# Patient Record
Sex: Male | Born: 1945 | Race: White | Hispanic: No | Marital: Married | Smoking: Never smoker
Health system: Southern US, Community
[De-identification: ages and names within clinical notes are randomized; demographics above are authoritative.]

## PROBLEM LIST (undated history)

## (undated) DIAGNOSIS — M1990 Unspecified osteoarthritis, unspecified site: Secondary | ICD-10-CM

## (undated) HISTORY — PX: JOINT REPLACEMENT: SHX530

## (undated) HISTORY — PX: TONSILLECTOMY: SUR1361

## (undated) HISTORY — PX: APPENDECTOMY: SHX54

---

## 2014-05-22 ENCOUNTER — Encounter: Payer: Self-pay | Admitting: Sports Medicine

## 2014-06-05 ENCOUNTER — Encounter: Payer: Self-pay | Admitting: Sports Medicine

## 2014-06-05 ENCOUNTER — Ambulatory Visit (INDEPENDENT_AMBULATORY_CARE_PROVIDER_SITE_OTHER): Payer: Medicare Other | Admitting: Sports Medicine

## 2014-06-05 ENCOUNTER — Ambulatory Visit (INDEPENDENT_AMBULATORY_CARE_PROVIDER_SITE_OTHER): Payer: Medicare Other

## 2014-06-05 VITALS — BP 126/75 | HR 75 | Wt 218.0 lb

## 2014-06-05 DIAGNOSIS — M1000 Idiopathic gout, unspecified site: Secondary | ICD-10-CM | POA: Diagnosis not present

## 2014-06-05 DIAGNOSIS — M19071 Primary osteoarthritis, right ankle and foot: Secondary | ICD-10-CM

## 2014-06-05 DIAGNOSIS — M25471 Effusion, right ankle: Secondary | ICD-10-CM | POA: Diagnosis not present

## 2014-06-05 NOTE — Progress Notes (Signed)
  Subjective:    CC: Right ankle pain   HPI:  For decades this pleasant 69 year old male has had pain that he localizes around his right ankle, he has no history of trauma or infection, he does have swelling, pain is moderate, persistent, worse with the first few steps, and better with movement.  Past medical history, Surgical history, Family history not pertinant except as noted below, Social history, Allergies, and medications have been entered into the medical record, reviewed, and no changes needed.   Review of Systems: No headache, visual changes, nausea, vomiting, diarrhea, constipation, dizziness, abdominal pain, skin rash, fevers, chills, night sweats, swollen lymph nodes, weight loss, chest pain, body aches, joint swelling, muscle aches, shortness of breath, mood changes, visual or auditory hallucinations.  Objective:    General: Well Developed, well nourished, and in no acute distress.  Neuro: Alert and oriented x3, extra-ocular muscles intact, sensation grossly intact.  HEENT: Normocephalic, atraumatic, pupils equal round reactive to light, neck supple, no masses, no lymphadenopathy, thyroid nonpalpable.  Skin: Warm and dry, no rashes noted.  Cardiac: Regular rate and rhythm, no murmurs rubs or gallops.  Respiratory: Clear to auscultation bilaterally. Not using accessory muscles, speaking in full sentences.  Abdominal: Soft, nontender, nondistended, positive bowel sounds, no masses, no organomegaly.  Right Ankle: Swollen, tender to palpation over the mortise, very limited range of motion. No palpable effusion Strength is 5/5 in all directions. Stable lateral and medial ligaments; squeeze test and kleiger test unremarkable; Talar dome nontender; No pain at base of 5th MT; No tenderness over cuboid; No tenderness over N spot or navicular prominence No tenderness on posterior aspects of lateral and medial malleolus No sign of peroneal tendon subluxations; Negative tarsal tunnel  tinel's  Procedure: Real-time Ultrasound Guided Injection of right ankle/talocrural joint Device: GE Logiq E  Verbal informed consent obtained.  Time-out conducted.  Noted no overlying erythema, induration, or other signs of local infection.  Skin prepped in a sterile fashion.  Local anesthesia: Topical Ethyl chloride.  With sterile technique and under real time ultrasound guidance:  Noted significant degenerative arthritis, 25-gauge needle advanced into the joint and 1 mL kenalog 40, 3 mL lidocaine injected easily. Completed without difficulty  Pain immediately resolved suggesting accurate placement of the medication.  Advised to call if fevers/chills, erythema, induration, drainage, or persistent bleeding.  Images permanently stored and available for review in the ultrasound unit.  Impression: Technically successful ultrasound guided injection.   Impression and Recommendations:    The patient was counselled, risk factors were discussed, anticipatory guidance given.

## 2014-06-05 NOTE — Assessment & Plan Note (Signed)
Injection as above. Meloxicam. X-rays, return for custom orthotics, testing for rheumatoid arthritis and gout.

## 2014-06-06 LAB — RHEUMATOID FACTOR: Rheumatoid fact SerPl-aCnc: 11 [IU]/mL (ref <=14)

## 2014-06-06 LAB — COMPREHENSIVE METABOLIC PANEL
AST: 24 U/L (ref 0–37)
Albumin: 4.1 g/dL (ref 3.5–5.2)
Alkaline Phosphatase: 62 U/L (ref 39–117)
BUN: 22 mg/dL (ref 6–23)
Calcium: 9.8 mg/dL (ref 8.4–10.5)
Chloride: 105 mEq/L (ref 96–112)
Creat: 0.93 mg/dL (ref 0.50–1.35)
Sodium: 140 mEq/L (ref 135–145)
Total Bilirubin: 0.5 mg/dL (ref 0.2–1.2)

## 2014-06-06 LAB — ANA: Anti Nuclear Antibody(ANA): NEGATIVE

## 2014-06-06 LAB — COMPREHENSIVE METABOLIC PANEL WITH GFR
ALT: 33 U/L (ref 0–53)
CO2: 24 meq/L (ref 19–32)
Glucose, Bld: 91 mg/dL (ref 70–99)
Potassium: 4 meq/L (ref 3.5–5.3)
Total Protein: 6.9 g/dL (ref 6.0–8.3)

## 2014-06-06 LAB — URIC ACID: Uric Acid, Serum: 6.7 mg/dL (ref 4.0–7.8)

## 2014-06-09 LAB — CYCLIC CITRUL PEPTIDE ANTIBODY, IGG: Cyclic Citrullin Peptide Ab: <2 U/mL (ref 0.0–5.0)

## 2014-06-10 DIAGNOSIS — M109 Gout, unspecified: Secondary | ICD-10-CM | POA: Insufficient documentation

## 2014-06-10 MED ORDER — ALLOPURINOL 300 MG PO TABS: 300.0000 mg | ORAL_TABLET | Freq: Every day | ORAL | Status: DC

## 2014-06-10 NOTE — Assessment & Plan Note (Signed)
Symptoms and presentation did not appear to represent acute gout, uric acid levels are slightly high so we are going to start once a day allopurinol and recheck in one month.

## 2014-06-10 NOTE — Addendum Note (Signed)
Addended by: Monica BectonHEKKEKANDAM, Yafet Cline J on: 06/10/2014 01:03 PM   Modules accepted: Orders

## 2014-06-30 ENCOUNTER — Encounter: Payer: Self-pay | Admitting: Sports Medicine

## 2014-06-30 ENCOUNTER — Ambulatory Visit (INDEPENDENT_AMBULATORY_CARE_PROVIDER_SITE_OTHER): Payer: Medicare Other | Admitting: Sports Medicine

## 2014-06-30 VITALS — BP 110/77 | HR 73 | Ht 71.0 in | Wt 216.0 lb

## 2014-06-30 DIAGNOSIS — M1000 Idiopathic gout, unspecified site: Secondary | ICD-10-CM

## 2014-06-30 DIAGNOSIS — M19071 Primary osteoarthritis, right ankle and foot: Secondary | ICD-10-CM

## 2014-06-30 MED ORDER — ALLOPURINOL 300 MG PO TABS: 300.0000 mg | ORAL_TABLET | Freq: Every day | ORAL | Status: DC

## 2014-06-30 NOTE — Progress Notes (Signed)

## 2014-06-30 NOTE — Assessment & Plan Note (Addendum)
Orthotics as above. Injection provided a few days of response. I do think he is headed towards ankle replacement versus fusion. We will see how the orthotics do first.

## 2014-08-11 ENCOUNTER — Ambulatory Visit: Payer: Medicare Other | Admitting: Sports Medicine

## 2014-08-11 ENCOUNTER — Ambulatory Visit: Payer: Medicare Other | Admitting: Family Medicine

## 2014-08-25 ENCOUNTER — Ambulatory Visit: Payer: Medicare Other | Admitting: Sports Medicine

## 2014-11-04 ENCOUNTER — Encounter: Payer: Self-pay | Admitting: Sports Medicine

## 2014-11-04 ENCOUNTER — Ambulatory Visit (INDEPENDENT_AMBULATORY_CARE_PROVIDER_SITE_OTHER): Payer: Medicare Other | Admitting: Sports Medicine

## 2014-11-04 DIAGNOSIS — M19071 Primary osteoarthritis, right ankle and foot: Secondary | ICD-10-CM

## 2014-11-04 NOTE — Assessment & Plan Note (Signed)
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 He is now a candidate for ankle replacement versus fusion, referral to [facility].

## 2014-11-04 NOTE — Progress Notes (Signed)
  Subjective:    CC: Follow-up   HPI: Right ankle osteoarthritis: End-stage, did not respond to rebuild rotation, NSAIDs, injection, or custom orthotics as expected, we did discuss that he would probably be heading towards total ankle arthroplasty.  Past medical history, Surgical history, Family history not pertinant except as noted below, Social history, Allergies, and medications have been entered into the medical record, reviewed, and no changes needed.   Review of Systems: No fevers, chills, night sweats, weight loss, chest pain, or shortness of breath.   Objective:    General: Well Developed, well nourished, and in no acute distress.  Neuro: Alert and oriented x3, extra-ocular muscles intact, sensation grossly intact.  HEENT: Normocephalic, atraumatic, pupils equal round reactive to light, neck supple, no masses, no lymphadenopathy, thyroid nonpalpable.  Skin: Warm and dry, no rashes. Cardiac: Regular rate and rhythm, no murmurs rubs or gallops, no lower extremity edema.  Respiratory: Clear to auscultation bilaterally. Not using accessory muscles, speaking in full sentences. Right Ankle: Visible swollen with tenderness over the mortise. Range of motion is full in all directions. Strength is 5/5 in all directions. Stable lateral and medial ligaments; squeeze test and kleiger test unremarkable; Talar dome nontender; No pain at base of 5th MT; No tenderness over cuboid; No tenderness over N spot or navicular prominence No tenderness on posterior aspects of lateral and medial malleolus No sign of peroneal tendon subluxations; Negative tarsal tunnel tinel's  Impression and Recommendations:

## 2015-06-06 ENCOUNTER — Other Ambulatory Visit: Payer: Self-pay | Admitting: Sports Medicine

## 2017-10-09 ENCOUNTER — Ambulatory Visit (INDEPENDENT_AMBULATORY_CARE_PROVIDER_SITE_OTHER): Payer: Medicare Other | Admitting: Sports Medicine

## 2017-10-09 ENCOUNTER — Encounter: Payer: Self-pay | Admitting: Sports Medicine

## 2017-10-09 DIAGNOSIS — M19071 Primary osteoarthritis, right ankle and foot: Secondary | ICD-10-CM

## 2017-10-09 NOTE — Progress Notes (Signed)
Subjective:    CC: Right ankle pain  HPI: Known osteoarthritis, right-sided, end-stage.  We injected him about 3 years ago, since then he has done okay with topical lidocaine patches and Celebrex.  I did refer him to [facility] for surgical consultation, conservative measures were done.  At this point he continues to have pain, and is agreeable to proceed with surgical intervention, he stepped in a hole, inverting his ankle, and does have some increasing pain.  I reviewed the past medical history, family history, social history, surgical history, and allergies today and no changes were needed.  Please see the problem list section below in epic for further details.  Past Medical History: History reviewed. No pertinent past medical history. Past Surgical History: History reviewed. No pertinent surgical history. Social History: Social History   Socioeconomic History  . Marital status: Married    Spouse name: Not on file  . Number of children: Not on file  . Years of education: Not on file  . Highest education level: Not on file  Occupational History  . Not on file  Social Needs  . Financial resource strain: Not on file  . Food insecurity:    Worry: Not on file    Inability: Not on file  . Transportation needs:    Medical: Not on file    Non-medical: Not on file  Tobacco Use  . Smoking status: Never Smoker  . Smokeless tobacco: Never Used  Substance and Sexual Activity  . Alcohol use: Not on file  . Drug use: Not on file  . Sexual activity: Not on file  Lifestyle  . Physical activity:    Days per week: Not on file    Minutes per session: Not on file  . Stress: Not on file  Relationships  . Social connections:    Talks on phone: Not on file    Gets together: Not on file    Attends religious service: Not on file    Active member of club or organization: Not on file    Attends meetings of clubs or organizations: Not on file    Relationship status: Not on file  Other  Topics Concern  . Not on file  Social History Narrative  . Not on file   Family History: No family history on file. Allergies: No Known Allergies Medications: See med rec.  Review of Systems: No fevers, chills, night sweats, weight loss, chest pain, or shortness of breath.   Objective:    General: Well Developed, well nourished, and in no acute distress.  Neuro: Alert and oriented x3, extra-ocular muscles intact, sensation grossly intact.  HEENT: Normocephalic, atraumatic, pupils equal round reactive to light, neck supple, no masses, no lymphadenopathy, thyroid nonpalpable.  Skin: Warm and dry, no rashes. Cardiac: Regular rate and rhythm, no murmurs rubs or gallops, no lower extremity edema.  Respiratory: Clear to auscultation bilaterally. Not using accessory muscles, speaking in full sentences. Right ankle: Swollen, tender all over.  No bruising. Range of motion is full in all directions. Strength is 5/5 in all directions. Stable lateral and medial ligaments; squeeze test and kleiger test unremarkable; Talar dome nontender; No pain at base of 5th MT; No tenderness over cuboid; No tenderness over N spot or navicular prominence No tenderness on posterior aspects of lateral and medial malleolus No sign of peroneal tendon subluxations; Negative tarsal tunnel tinel's Able to walk 4 steps.  Procedure: Real-time Ultrasound Guided Injection of right ankle joint Device: GE Logiq E  Verbal  informed consent obtained.  Time-out conducted.  Noted no overlying erythema, induration, or other signs of local infection.  Skin prepped in a sterile fashion.  Local anesthesia: Topical Ethyl chloride.  With sterile technique and under real time ultrasound guidance: Severe osteoarthritis noted, I found a good approach angle with fewer osteophytes, and guided the needle into the joint, injected 1 cc kenalog 40, 1 cc lidocaine, 1 cc bupivacaine. Completed without difficulty  Pain immediately  resolved suggesting accurate placement of the medication.  Advised to call if fevers/chills, erythema, induration, drainage, or persistent bleeding.  Images permanently stored and available for review in the ultrasound unit.  Impression: Technically successful ultrasound guided injection.  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 Previous injection was 4 years ago. [facility], they suggested arthrodesis, he was not a good candidate for arthroplasty. He does okay with topical lidocaine patches, Celebrex. Unfortunately had a reinjury, injection as above, I would like a second opinion from Dr. Victorino DikeHewitt regarding ankle arthrodesis. ___________________________________________ Ihor Austinhomas J. Benjamin Stainhekkekandam, M.D., ABFM., CAQSM. Primary Care and Sports Medicine [facility]  Adjunct Instructor of Family Medicine  [employer]

## 2017-10-09 NOTE — Assessment & Plan Note (Signed)
End-stage right ankle osteoarthritis. Previous injection was 4 years ago. We [facility], they suggested arthrodesis, he was not a good candidate for arthroplasty. He does okay with topical lidocaine patches, Celebrex. Unfortunately had a reinjury, injection as above, I would like a second opinion from Dr. Victorino DikeHewitt regarding ankle arthrodesis.

## 2017-10-11 ENCOUNTER — Ambulatory Visit (INDEPENDENT_AMBULATORY_CARE_PROVIDER_SITE_OTHER): Payer: Medicare Other

## 2017-10-11 DIAGNOSIS — M19071 Primary osteoarthritis, right ankle and foot: Secondary | ICD-10-CM | POA: Diagnosis not present

## 2017-12-06 ENCOUNTER — Other Ambulatory Visit (HOSPITAL_COMMUNITY): Payer: Self-pay | Admitting: Orthopedic Surgery

## 2017-12-29 ENCOUNTER — Encounter (HOSPITAL_BASED_OUTPATIENT_CLINIC_OR_DEPARTMENT_OTHER): Payer: Self-pay | Admitting: *Deleted

## 2017-12-29 ENCOUNTER — Other Ambulatory Visit: Payer: Self-pay

## 2018-01-04 ENCOUNTER — Other Ambulatory Visit: Payer: Self-pay

## 2018-01-04 ENCOUNTER — Ambulatory Visit (HOSPITAL_BASED_OUTPATIENT_CLINIC_OR_DEPARTMENT_OTHER): Payer: Medicare Other | Admitting: Anesthesiology

## 2018-01-04 ENCOUNTER — Encounter (HOSPITAL_BASED_OUTPATIENT_CLINIC_OR_DEPARTMENT_OTHER): Payer: Self-pay

## 2018-01-04 ENCOUNTER — Encounter (HOSPITAL_BASED_OUTPATIENT_CLINIC_OR_DEPARTMENT_OTHER)
Admission: RE | Disposition: A | Discharge status: Home / Self Care | Payer: Self-pay | Source: Ambulatory Visit | Attending: Orthopedic Surgery

## 2018-01-04 ENCOUNTER — Ambulatory Visit (HOSPITAL_BASED_OUTPATIENT_CLINIC_OR_DEPARTMENT_OTHER)
Admission: RE | Admit: 2018-01-04 | Discharge: 2018-01-04 | Disposition: A | Discharge status: Home / Self Care | Payer: Medicare Other | Source: Ambulatory Visit | Attending: Orthopedic Surgery | Admitting: Orthopedic Surgery

## 2018-01-04 DIAGNOSIS — M19071 Primary osteoarthritis, right ankle and foot: Secondary | ICD-10-CM | POA: Diagnosis present

## 2018-01-04 HISTORY — PX: ANKLE FUSION: SHX881

## 2018-01-04 HISTORY — DX: Unspecified osteoarthritis, unspecified site: M19.90

## 2018-01-04 SURGERY — ARTHRODESIS ANKLE
Anesthesia: General | Site: Ankle | Laterality: Right

## 2018-01-04 MED ORDER — FENTANYL CITRATE (PF) 100 MCG/2ML IJ SOLN
50.0000 ug | INTRAMUSCULAR | Status: DC | PRN
  Administered 2018-01-04: 50 ug via INTRAVENOUS

## 2018-01-04 MED ORDER — CEFAZOLIN SODIUM-DEXTROSE 2-4 GM/100ML-% IV SOLN
INTRAVENOUS | Status: AC
  Filled 2018-01-04: qty 100

## 2018-01-04 MED ORDER — SENNA 8.6 MG PO TABS: 2.0000 | ORAL_TABLET | Freq: Two times a day (BID) | ORAL | 0 refills | Status: AC

## 2018-01-04 MED ORDER — EPHEDRINE SULFATE 50 MG/ML IJ SOLN
INTRAMUSCULAR | Status: DC | PRN
  Administered 2018-01-04: 15 mg via INTRAVENOUS

## 2018-01-04 MED ORDER — LIDOCAINE 2% (20 MG/ML) 5 ML SYRINGE
INTRAMUSCULAR | Status: AC
  Filled 2018-01-04: qty 5

## 2018-01-04 MED ORDER — ONDANSETRON HCL 4 MG/2ML IJ SOLN
INTRAMUSCULAR | Status: DC | PRN
  Administered 2018-01-04: 4 mg via INTRAVENOUS

## 2018-01-04 MED ORDER — LIDOCAINE HCL (CARDIAC) PF 100 MG/5ML IV SOSY
PREFILLED_SYRINGE | INTRAVENOUS | Status: DC | PRN
  Administered 2018-01-04: 30 mg via INTRAVENOUS

## 2018-01-04 MED ORDER — FENTANYL CITRATE (PF) 100 MCG/2ML IJ SOLN
INTRAMUSCULAR | Status: AC
  Filled 2018-01-04: qty 2

## 2018-01-04 MED ORDER — DOCUSATE SODIUM 100 MG PO CAPS: 100.0000 mg | ORAL_CAPSULE | Freq: Two times a day (BID) | ORAL | 0 refills | Status: AC

## 2018-01-04 MED ORDER — PROPOFOL 10 MG/ML IV BOLUS
INTRAVENOUS | Status: AC
  Filled 2018-01-04: qty 20

## 2018-01-04 MED ORDER — CEFAZOLIN SODIUM-DEXTROSE 2-4 GM/100ML-% IV SOLN
2.0000 g | INTRAVENOUS | Status: AC
  Administered 2018-01-04: 2 g via INTRAVENOUS

## 2018-01-04 MED ORDER — ONDANSETRON HCL 4 MG/2ML IJ SOLN
INTRAMUSCULAR | Status: AC
  Filled 2018-01-04: qty 2

## 2018-01-04 MED ORDER — FENTANYL CITRATE (PF) 100 MCG/2ML IJ SOLN: 25.0000 ug | INTRAMUSCULAR | Status: DC | PRN

## 2018-01-04 MED ORDER — PROPOFOL 10 MG/ML IV BOLUS
INTRAVENOUS | Status: DC | PRN
  Administered 2018-01-04: 200 mg via INTRAVENOUS

## 2018-01-04 MED ORDER — EPHEDRINE 5 MG/ML INJ
INTRAVENOUS | Status: AC
  Filled 2018-01-04: qty 10

## 2018-01-04 MED ORDER — DEXAMETHASONE SODIUM PHOSPHATE 10 MG/ML IJ SOLN
INTRAMUSCULAR | Status: AC
  Filled 2018-01-04: qty 1

## 2018-01-04 MED ORDER — CHLORHEXIDINE GLUCONATE 4 % EX LIQD: 60.0000 mL | Freq: Once | CUTANEOUS | Status: DC

## 2018-01-04 MED ORDER — ROPIVACAINE HCL 5 MG/ML IJ SOLN
INTRAMUSCULAR | Status: DC | PRN
  Administered 2018-01-04: 30 mL via PERINEURAL
  Administered 2018-01-04: 10 mL via PERINEURAL

## 2018-01-04 MED ORDER — 0.9 % SODIUM CHLORIDE (POUR BTL) OPTIME
TOPICAL | Status: DC | PRN
  Administered 2018-01-04: 600 mL

## 2018-01-04 MED ORDER — ONDANSETRON HCL 4 MG/2ML IJ SOLN: 4.0000 mg | Freq: Once | INTRAMUSCULAR | Status: DC | PRN

## 2018-01-04 MED ORDER — LACTATED RINGERS IV SOLN
INTRAVENOUS | Status: DC
  Administered 2018-01-04: 11:00:00 via INTRAVENOUS

## 2018-01-04 MED ORDER — SCOPOLAMINE 1 MG/3DAYS TD PT72: 1.0000 | MEDICATED_PATCH | Freq: Once | TRANSDERMAL | Status: DC | PRN

## 2018-01-04 MED ORDER — SODIUM CHLORIDE 0.9 % IV SOLN: INTRAVENOUS | Status: DC

## 2018-01-04 MED ORDER — MIDAZOLAM HCL 2 MG/2ML IJ SOLN: 1.0000 mg | INTRAMUSCULAR | Status: DC | PRN

## 2018-01-04 MED ORDER — OXYCODONE HCL 5 MG PO TABS: 5.0000 mg | ORAL_TABLET | ORAL | 0 refills | Status: AC | PRN

## 2018-01-04 MED ORDER — ASPIRIN EC 81 MG PO TBEC: 81.0000 mg | DELAYED_RELEASE_TABLET | Freq: Two times a day (BID) | ORAL | 0 refills | Status: AC

## 2018-01-04 MED ORDER — DEXAMETHASONE SODIUM PHOSPHATE 4 MG/ML IJ SOLN
INTRAMUSCULAR | Status: DC | PRN
  Administered 2018-01-04: 10 mg via INTRAVENOUS

## 2018-01-04 SURGICAL SUPPLY — 82 items
BANDAGE ESMARK 6X9 LF (GAUZE/BANDAGES/DRESSINGS) ×1 IMPLANT
BIT DRILL 4 CANN (BIT) ×3 IMPLANT
BLADE AVERAGE 25MMX9MM (BLADE)
BLADE AVERAGE 25X9 (BLADE) IMPLANT
BLADE MICRO SAGITTAL (BLADE) IMPLANT
BLADE SURG 15 STRL LF DISP TIS (BLADE) ×3 IMPLANT
BLADE SURG 15 STRL SS (BLADE) ×6
BNDG COHESIVE 4X5 TAN STRL (GAUZE/BANDAGES/DRESSINGS) ×3 IMPLANT
BNDG COHESIVE 6X5 TAN STRL LF (GAUZE/BANDAGES/DRESSINGS) ×3 IMPLANT
BNDG ESMARK 6X9 LF (GAUZE/BANDAGES/DRESSINGS) ×3
BONE GRAFT AUGMENT 3CC (Orthopedic Implant) ×3 IMPLANT
BOOT STEPPER DURA LG (SOFTGOODS) IMPLANT
BOOT STEPPER DURA MED (SOFTGOODS) IMPLANT
BOOT STEPPER DURA XLG (SOFTGOODS) IMPLANT
BUR EGG 3PK/BX (BURR) ×3 IMPLANT
CHLORAPREP W/TINT 26ML (MISCELLANEOUS) ×3 IMPLANT
CLOSURE WOUND 1/2 X4 (GAUZE/BANDAGES/DRESSINGS)
COVER BACK TABLE 60X90IN (DRAPES) ×3 IMPLANT
COVER MAYO STAND STRL (DRAPES) ×3 IMPLANT
COVER WAND RF STERILE (DRAPES) IMPLANT
CUFF TOURNIQUET SINGLE 34IN LL (TOURNIQUET CUFF) ×3 IMPLANT
DECANTER SPIKE VIAL GLASS SM (MISCELLANEOUS) IMPLANT
DRAPE EXTREMITY T 121X128X90 (DRAPE) ×3 IMPLANT
DRAPE OEC MINIVIEW 54X84 (DRAPES) ×3 IMPLANT
DRAPE U-SHAPE 47X51 STRL (DRAPES) ×3 IMPLANT
DRAPE UTILITY XL STRL (DRAPES) IMPLANT
DRSG MEPITEL 4X7.2 (GAUZE/BANDAGES/DRESSINGS) ×3 IMPLANT
DRSG PAD ABDOMINAL 8X10 ST (GAUZE/BANDAGES/DRESSINGS) ×6 IMPLANT
ELECT REM PT RETURN 9FT ADLT (ELECTROSURGICAL) ×3
ELECTRODE REM PT RTRN 9FT ADLT (ELECTROSURGICAL) ×1 IMPLANT
GAUZE SPONGE 4X4 12PLY STRL (GAUZE/BANDAGES/DRESSINGS) ×3 IMPLANT
GLOVE BIO SURGEON STRL SZ 6.5 (GLOVE) ×4 IMPLANT
GLOVE BIO SURGEON STRL SZ8 (GLOVE) ×3 IMPLANT
GLOVE BIO SURGEONS STRL SZ 6.5 (GLOVE) ×2
GLOVE BIOGEL PI IND STRL 7.0 (GLOVE) ×3 IMPLANT
GLOVE BIOGEL PI IND STRL 8 (GLOVE) ×2 IMPLANT
GLOVE BIOGEL PI INDICATOR 7.0 (GLOVE) ×6
GLOVE BIOGEL PI INDICATOR 8 (GLOVE) ×4
GLOVE ECLIPSE 8.0 STRL XLNG CF (GLOVE) ×3 IMPLANT
GOWN STRL REUS W/ TWL LRG LVL3 (GOWN DISPOSABLE) ×2 IMPLANT
GOWN STRL REUS W/ TWL XL LVL3 (GOWN DISPOSABLE) ×2 IMPLANT
GOWN STRL REUS W/TWL LRG LVL3 (GOWN DISPOSABLE) ×4
GOWN STRL REUS W/TWL XL LVL3 (GOWN DISPOSABLE) ×4
GUIDEWIRE W/TROCAR NT 12IN (WIRE) ×3 IMPLANT
INSTRUMENT LRG BB TAK DISP (EXFIX) ×6 IMPLANT
K-WIRE 9  SMOOTH .062 (WIRE) IMPLANT
KIT ASP BONE MRW 60CC (KITS) IMPLANT
MILL MEDIUM DISP (BLADE) IMPLANT
NEEDLE HYPO 22GX1.5 SAFETY (NEEDLE) IMPLANT
NS IRRIG 1000ML POUR BTL (IV SOLUTION) ×3 IMPLANT
PACK BASIN DAY SURGERY FS (CUSTOM PROCEDURE TRAY) ×3 IMPLANT
PAD CAST 4YDX4 CTTN HI CHSV (CAST SUPPLIES) ×1 IMPLANT
PADDING CAST COTTON 4X4 STRL (CAST SUPPLIES) ×2
PADDING CAST COTTON 6X4 STRL (CAST SUPPLIES) ×3 IMPLANT
PENCIL BUTTON HOLSTER BLD 10FT (ELECTRODE) ×3 IMPLANT
SANITIZER HAND PURELL 535ML FO (MISCELLANEOUS) ×3 IMPLANT
SCREW CANN 6.7X55 18 THD SD (Screw) ×3 IMPLANT
SCREW LOW PROFILE TIT 6.7X75 (Screw) ×3 IMPLANT
SCREW LP TI 6.7X50M CANN 18THR (Screw) ×3 IMPLANT
SHEET MEDIUM DRAPE 40X70 STRL (DRAPES) ×3 IMPLANT
SLEEVE SCD COMPRESS KNEE MED (MISCELLANEOUS) ×3 IMPLANT
SPLINT FAST PLASTER 5X30 (CAST SUPPLIES) ×40
SPLINT PLASTER CAST FAST 5X30 (CAST SUPPLIES) ×20 IMPLANT
SPONGE LAP 18X18 RF (DISPOSABLE) ×3 IMPLANT
STOCKINETTE 6  STRL (DRAPES) ×2
STOCKINETTE 6 STRL (DRAPES) ×1 IMPLANT
STRIP CLOSURE SKIN 1/2X4 (GAUZE/BANDAGES/DRESSINGS) IMPLANT
SUCTION FRAZIER HANDLE 10FR (MISCELLANEOUS) ×2
SUCTION TUBE FRAZIER 10FR DISP (MISCELLANEOUS) ×1 IMPLANT
SUT ETHILON 3 0 PS 1 (SUTURE) ×3 IMPLANT
SUT MNCRL AB 3-0 PS2 18 (SUTURE) ×3 IMPLANT
SUT VIC AB 0 SH 27 (SUTURE) ×3 IMPLANT
SUT VIC AB 2-0 SH 27 (SUTURE) ×2
SUT VIC AB 2-0 SH 27XBRD (SUTURE) ×1 IMPLANT
SYR 3ML 23GX1 SAFETY (SYRINGE) ×3 IMPLANT
SYR BULB 3OZ (MISCELLANEOUS) ×3 IMPLANT
SYR CONTROL 10ML LL (SYRINGE) IMPLANT
TOWEL GREEN STERILE FF (TOWEL DISPOSABLE) ×6 IMPLANT
TUBE CONNECTING 20'X1/4 (TUBING) ×1
TUBE CONNECTING 20X1/4 (TUBING) ×2 IMPLANT
UNDERPAD 30X30 (UNDERPADS AND DIAPERS) ×3 IMPLANT
YANKAUER SUCT BULB TIP NO VENT (SUCTIONS) IMPLANT

## 2018-01-04 NOTE — Anesthesia Procedure Notes (Signed)
Anesthesia Regional Block: Adductor canal block   Pre-Anesthetic Checklist: ,, timeout performed, Correct Patient, Correct Site, Correct Laterality, Correct Procedure, Correct Position, site marked, Risks and benefits discussed,  Surgical consent,  Pre-op evaluation,  At surgeon's request and post-op pain management  Laterality: Right  Prep: chloraprep       Needles:  Injection technique: Single-shot  Needle Type: Echogenic Needle     Needle Length: 9cm  Needle Gauge: 21     Additional Needles:   Procedures:,,,, ultrasound used (permanent image in chart),,,,  Narrative:  Start time: 01/04/2018 11:03 AM End time: 01/04/2018 11:05 AM Injection made incrementally with aspirations every 5 mL.  Performed by: Personally  Anesthesiologist: Cecile Hearingurk, Kristina Mcnorton Edward, MD  Additional Notes: No pain on injection. No increased resistance to injection. Injection made in 5cc increments.  Good needle visualization.  Patient tolerated procedure well.

## 2018-01-04 NOTE — Progress Notes (Signed)
Assisted Dr. Turk with right, ultrasound guided, popliteal/saphenous block. Side rails up, monitors on throughout procedure. See vital signs in flow sheet. Tolerated Procedure well. 

## 2018-01-04 NOTE — Transfer of Care (Signed)
Immediate Anesthesia Transfer of Care Note  Patient: Randall SaxLeroy F Mersereau Jr.  Procedure(s) Performed: Right ankle arthrodesis (Right Ankle)  Patient Location: PACU  Anesthesia Type:General and Regional  Level of Consciousness: awake and sedated  Airway & Oxygen Therapy: Patient Spontanous Breathing and Patient connected to face mask oxygen  Post-op Assessment: Report given to RN and Post -op Vital signs reviewed and stable  Post vital signs: Reviewed and stable  Last Vitals:  Vitals Value Taken Time  BP    Temp    Pulse    Resp    SpO2      Last Pain:  Vitals:   01/04/18 1019  TempSrc: Oral  PainSc: 1       Patients Stated Pain Goal: 3 (01/04/18 1019)  Complications: No apparent anesthesia complications

## 2018-01-04 NOTE — H&P (Signed)
Randall SaxLeroy F Barefield Jr. is an 72 y.o. male.   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  He has failed nonoperative treatment for this painful condition including activity modification, oral anti-inflammatories, bracing and therapy.  He presents now for operative treatment of this painful and limiting condition.  Past Medical History:  Diagnosis Date  . Arthritis     Past Surgical History:  Procedure Laterality Date  . APPENDECTOMY    . JOINT REPLACEMENT     both knees  . TONSILLECTOMY      History reviewed. No pertinent family history. Social History:  reports that he has never smoked. He has never used smokeless tobacco. He reports that he does not drink alcohol or use drugs.  Allergies: No Known Allergies  Medications Prior to Admission  Medication Sig Dispense Refill  . aspirin 81 MG tablet Take 81 mg by mouth daily.    . celecoxib (CELEBREX) 200 MG capsule Take 200 mg by mouth 2 (two) times daily.    . Multiple Vitamin (MULTIVITAMIN) tablet Take 1 tablet by mouth daily.    . pravastatin (PRAVACHOL) 10 MG tablet Take 10 mg by mouth daily.    Marland Kitchen. albuterol (PROVENTIL HFA;VENTOLIN HFA) 108 (90 BASE) MCG/ACT inhaler Inhale 2 puffs into the lungs every 6 (six) hours as needed for wheezing or shortness of breath.      No results found for this or any previous visit (from the past 48 hour(s)). No results found.  ROS no recent fever, chills, nausea, vomiting or changes in his appetite  Blood pressure (!) 152/85, pulse (!) 59, temperature 98.3 F (36.8 C), temperature source Oral, resp. rate 12, height 5\' 11"  (1.803 m), weight 96.2 kg, SpO2 98 %. Physical Exam  Well-nourished well-developed man in no apparent distress.  Alert and oriented x4.  Mood and affect are normal.  Extraocular motions are intact.  Respirations are unlabored.  Gait is antalgic to the right.  Right  ankle has decreased range of motion.  Skin is healthy and intact.  No lymphadenopathy.  Pulses are palpable.  Sensibility to light touch is intact dorsally and plantarly at the foot.  He is tender to palpation along the anteromedial and anterolateral joint line.  Assessment/Plan Right ankle post traumatic arthritis -to the operating room today for right ankle arthrodesis.  The risks and benefits of the alternative treatment options have been discussed in detail.  The patient wishes to proceed with surgery and specifically understands risks of bleeding, infection, nerve damage, blood clots, need for additional surgery, amputation and death.   Toni ArthursJohn Kiriana Worthington, MD 01/04/2018, 11:59 AM

## 2018-01-04 NOTE — Anesthesia Postprocedure Evaluation (Signed)
Anesthesia Post Note  Patient: Randall SaxLeroy F Cervantes Jr.  Procedure(s) Performed: Right ankle arthrodesis (Right Ankle)     Patient location during evaluation: PACU Anesthesia Type: General Level of consciousness: awake and alert, awake and oriented Pain management: pain level controlled Vital Signs Assessment: post-procedure vital signs reviewed and stable Respiratory status: spontaneous breathing, nonlabored ventilation and respiratory function stable Cardiovascular status: blood pressure returned to baseline and stable Postop Assessment: no apparent nausea or vomiting Anesthetic complications: no    Last Vitals:  Vitals:   01/04/18 1415 01/04/18 1430  BP: 135/83 134/81  Pulse: 65 64  Resp: 14 13  Temp:    SpO2: 100% 100%    Last Pain:  Vitals:   01/04/18 1430  TempSrc:   PainSc: 0-No pain                 Cecile HearingStephen Edward Paisley Grajeda

## 2018-01-04 NOTE — Op Note (Signed)
01/04/2018  2:01 PM  PATIENT:  Randall SaxLeroy F Crossett Jr.  72 y.o. male  PRE-OPERATIVE DIAGNOSIS:  primary osteoarthritis right ankle  POST-OPERATIVE DIAGNOSIS:  primary osteoarthritis right ankle  Procedure(s):   1.  Right ankle arthrodesis    2.  Right ankle AP, mortise and lateral xrays  SURGEON:  Toni ArthursJohn Nikol Lemar, MD  ASSISTANT: Alfredo MartinezJustin Ollis, PA-C  ANESTHESIA:   General, regional  EBL:  minimal   TOURNIQUET:   Total Tourniquet Time Documented: Thigh (Right) - 77 minutes Total: Thigh (Right) - 77 minutes  COMPLICATIONS:  None apparent  DISPOSITION:  Extubated, awake and stable to recovery.  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  He has failed nonoperative treatment to date including activity modification, oral anti-inflammatories, bracing and physical therapy.  He presents now for operative treatment of this painful and limiting condition.  The risks and benefits of the alternative treatment options have been discussed in detail.  The patient wishes to proceed with surgery and specifically understands risks of bleeding, infection, nerve damage, blood clots, need for additional surgery, amputation and death.  PROCEDURE IN DETAIL:  After pre operative consent was obtained, and the correct operative site was identified, the patient was brought to the operating room and placed supine on the OR table.  Anesthesia was administered.  Pre-operative antibiotics were administered.  A surgical timeout was taken.  The right lower externally was then prepped and draped in standard sterile fashion.  The extremity was elevated and the tourniquet was inflated to 250 mmHg.  A longitudinal incision was then made over the extensor houses longus tendon.  Dissection was carried down through the subcutaneous tissues.  The extensor retinaculum was incised over the EHL and released proximally and distally.  The interval between the EHL and the  tibialis anterior was then developed taking care to protect the neurovascular bundle.  The anterior joint was exposed.  The joint capsule was incised and elevated medially and laterally.  Large anterior osteophytes were removed with a rondure.  The joint was exposed.  There was severe arthritis involving both sides of the joint with virtually no cartilage left on any joint surface.  The wound was irrigated copiously.  A cutting bur was used to remove the remaining subchondral bone on both sides of the joint.  The bur was used to dimple the cancellus bone.  The wound was thoroughly irrigated throughout this process in order to avoid overheating the bone surfaces.  The joint was reduced and provisionally pinned.  AP and lateral radiographs confirmed appropriate alignment of the tibiotalar joint.  3 cc of Augment bone graft substitute was inserted into the joint.  The joint was again reduced and the pin advanced across into the talus from the medial tibia.  The guidepin was overdrilled and a 6.7 mm Arthrex cannulated screw was inserted.  It was noted to compress the joint appropriately and have excellent purchase.  A second pin was then placed from the posterior lateral tibia across to the head of the talus.  AP and lateral radiographs confirmed appropriate position of the guidewire.  The guidewire was overdrilled and a partially threaded cannulated screw was inserted.  A third screw was then positioned in the same fashion from the anterolateral tibia into the posterior medial talus.  Final AP, mortise and lateral radiographs confirmed appropriate position and length of all screws and appropriate reduction of the tibiotalar joint.  The anterior joint capsule was repaired  with 0 Vicryl.  The extensor retinaculum was repaired with 0 Vicryl.  The sub-cutaneous tissues were approximated with 3-0 Monocryl.  The skin incision was closed with a running 3-0 nylon.  Sterile dressings were applied followed by a well-padded  short leg splint.  The tourniquet was released after application of the dressings.  The patient was awakened from anesthesia and transported to the recovery room in stable condition.   FOLLOW UP PLAN: The patient will be nonweightbearing on the right lower extremity.  Aspirin 81 mg p.o. twice daily for DVT prophylaxis.  Follow-up in the office in 2 weeks for suture removal and conversion to a short leg cast.   RADIOGRAPHS: AP, mortise and lateral radiographs of the right ankle are obtained intraoperatively.  These show interval arthrodesis of the right ankle joint.  Hardware is appropriately positioned and of the appropriate lengths.    Alfredo Martinez PA-C was present and scrubbed for the duration of the operative case. His assistance was essential in positioning the patient, prepping and draping, gaining and maintaining exposure, performing the operation, closing and dressing the wounds and applying the splint.

## 2018-01-04 NOTE — Discharge Instructions (Addendum)
Randall Hewitt, MD ° Orthopaedics ° °Please read the following information regarding your care after surgery. ° °Medications  °You only need a prescription for the narcotic pain medicine (ex. oxycodone, Percocet, Norco).  All of the other medicines listed below are available over the counter. °X Aleve 2 pills twice a day for the first 3 days after surgery. °X acetominophen (Tylenol) 650 mg every 4-6 hours as you need for minor to moderate pain °X oxycodone as prescribed for severe pain ° °Narcotic pain medicine (ex. oxycodone, Percocet, Vicodin) will cause constipation.  To prevent this problem, take the following medicines while you are taking any pain medicine. °X docusate sodium (Colace) 100 mg twice a day X senna (Senokot) 2 tablets twice a day ° °X To help prevent blood clots, take a baby aspirin (81 mg) twice a day after surgery.  You should also get up every hour while you are awake to move around.   ° °Weight Bearing °X Do not bear any weight on the operated leg or foot. ° °Cast / Splint / Dressing °X Keep your splint, cast or dressing clean and dry.  Don’t put anything (coat hanger, pencil, etc) down inside of it.  If it gets damp, use a hair dryer on the cool setting to dry it.  If it gets soaked, call the office to schedule an appointment for a cast change. ° °After your dressing, cast or splint is removed; you may shower, but do not soak or scrub the wound.  Allow the water to run over it, and then gently pat it dry. ° °Swelling °It is normal for you to have swelling where you had surgery.  To reduce swelling and pain, keep your toes above your nose for at least 3 days after surgery.  It may be necessary to keep your foot or leg elevated for several weeks.  If it hurts, it should be elevated. ° °Follow Up °Call my office at [phone] when you are discharged from the hospital or surgery center to schedule an appointment to be seen two weeks after surgery. ° °Call my office at [phone] if you  develop a fever >101.5° F, nausea, vomiting, bleeding from the surgical site or severe pain.   ° ° ° °Post Anesthesia Home Care Instructions ° °Activity: °Get plenty of rest for the remainder of the day. A responsible individual must stay with you for 24 hours following the procedure.  °For the next 24 hours, DO NOT: °-Drive a car °-Operate machinery °-Drink alcoholic beverages °-Take any medication unless instructed by your physician °-Make any legal decisions or sign important papers. ° °Meals: °Start with liquid foods such as gelatin or soup. Progress to regular foods as tolerated. Avoid greasy, spicy, heavy foods. If nausea and/or vomiting occur, drink only clear liquids until the nausea and/or vomiting subsides. Call your physician if vomiting continues. ° °Special Instructions/Symptoms: °Your throat may feel dry or sore from the anesthesia or the breathing tube placed in your throat during surgery. If this causes discomfort, gargle with warm salt water. The discomfort should disappear within 24 hours. ° °If you had a scopolamine patch placed behind your ear for the management of post- operative nausea and/or vomiting: ° °1. The medication in the patch is effective for 72 hours, after which it should be removed.  Wrap patch in a tissue and discard in the trash. Wash hands thoroughly with soap and water. °2. You may remove the patch earlier than 72 hours if you experience unpleasant side   effects which may include dry mouth, dizziness or visual disturbances. °3. Avoid touching the patch. Wash your hands with soap and water after contact with the patch. °  ° ° °Regional Anesthesia Blocks ° °1. Numbness or the inability to move the "blocked" extremity may last from 3-48 hours after placement. The length of time depends on the medication injected and your individual response to the medication. If the numbness is not going away after 48 hours, call your surgeon. ° °2. The extremity that is blocked will need to be  protected until the numbness is gone and the  Strength has returned. Because you cannot feel it, you will need to take extra care to avoid injury. Because it may be weak, you may have difficulty moving it or using it. You may not know what position it is in without looking at it while the block is in effect. ° °3. For blocks in the legs and feet, returning to weight bearing and walking needs to be done carefully. You will need to wait until the numbness is entirely gone and the strength has returned. You should be able to move your leg and foot normally before you try and bear weight or walk. You will need someone to be with you when you first try to ensure you do not fall and possibly risk injury. ° °4. Bruising and tenderness at the needle site are common side effects and will resolve in a few days. ° °5. Persistent numbness or new problems with movement should be communicated to the surgeon or the [facility] ([phone])/ [facility] ([phone]). °

## 2018-01-04 NOTE — Anesthesia Procedure Notes (Signed)
Procedure Name: LMA Insertion Date/Time: 01/04/2018 12:27 PM Performed by: York GricePearson, Aqeel Norgaard W, CRNA Pre-anesthesia Checklist: Patient identified, Emergency Drugs available, Suction available and Patient being monitored Patient Re-evaluated:Patient Re-evaluated prior to induction Oxygen Delivery Method: Circle system utilized Preoxygenation: Pre-oxygenation with 100% oxygen Induction Type: IV induction Ventilation: Mask ventilation without difficulty LMA: LMA inserted LMA Size: 4.0 Number of attempts: 1 Airway Equipment and Method: Bite block Placement Confirmation: positive ETCO2 Tube secured with: Tape Dental Injury: Teeth and Oropharynx as per pre-operative assessment

## 2018-01-04 NOTE — Anesthesia Preprocedure Evaluation (Addendum)
Anesthesia Evaluation  Patient identified by MRN, date of birth, ID band Patient awake    Reviewed: Allergy & Precautions, NPO status , Patient's Chart, lab work & pertinent test results  Airway Mallampati: II  TM Distance: >3 FB Neck ROM: Full    Dental  (+) Teeth Intact, Dental Advisory Given   Pulmonary neg pulmonary ROS,    Pulmonary exam normal breath sounds clear to auscultation       Cardiovascular Exercise Tolerance: Good negative cardio ROS Normal cardiovascular exam Rhythm:Regular Rate:Normal     Neuro/Psych negative neurological ROS  negative psych ROS   GI/Hepatic negative GI ROS, Neg liver ROS,   Endo/Other  negative endocrine ROS  Renal/GU negative Renal ROS     Musculoskeletal  (+) Arthritis ,   Abdominal   Peds  Hematology negative hematology ROS (+)   Anesthesia Other Findings Day of surgery medications reviewed with the patient.  Reproductive/Obstetrics                             Anesthesia Physical Anesthesia Plan  ASA: II  Anesthesia Plan: General   Post-op Pain Management:    Induction: Intravenous  PONV Risk Score and Plan: 2 and Dexamethasone and Ondansetron  Airway Management Planned: LMA  Additional Equipment:   Intra-op Plan:   Post-operative Plan: Extubation in OR  Informed Consent: I have reviewed the patients History and Physical, chart, labs and discussed the procedure including the risks, benefits and alternatives for the proposed anesthesia with the patient or authorized representative who has indicated his/her understanding and acceptance.   Dental advisory given  Plan Discussed with: CRNA  Anesthesia Plan Comments:         Anesthesia Quick Evaluation

## 2018-01-04 NOTE — Anesthesia Procedure Notes (Signed)
Anesthesia Regional Block: Popliteal block   Pre-Anesthetic Checklist: ,, timeout performed, Correct Patient, Correct Site, Correct Laterality, Correct Procedure, Correct Position, site marked, Risks and benefits discussed,  Surgical consent,  Pre-op evaluation,  At surgeon's request and post-op pain management  Laterality: Right  Prep: chloraprep       Needles:  Injection technique: Single-shot  Needle Type: Echogenic Needle     Needle Length: 9cm  Needle Gauge: 21     Additional Needles:   Procedures:,,,, ultrasound used (permanent image in chart),,,,  Narrative:  Start time: 01/04/2018 10:58 AM End time: 01/04/2018 11:03 AM Injection made incrementally with aspirations every 5 mL.  Performed by: Personally  Anesthesiologist: Cecile Hearingurk, Nerea Bordenave Edward, MD  Additional Notes: No pain on injection. No increased resistance to injection. Injection made in 5cc increments.  Good needle visualization.  Patient tolerated procedure well.

## 2018-01-05 ENCOUNTER — Encounter (HOSPITAL_BASED_OUTPATIENT_CLINIC_OR_DEPARTMENT_OTHER): Payer: Self-pay | Admitting: Orthopedic Surgery

## 2020-05-19 IMAGING — DX DG ANKLE COMPLETE 3+V*R*
3 series · 3 of 3 positions shown · non-contrast
Comparison: None.

CLINICAL DATA: Chronic right ankle pain.  Initial encounter.

EXAM:
RIGHT ANKLE - COMPLETE 3+ VIEW

[ankle ap]
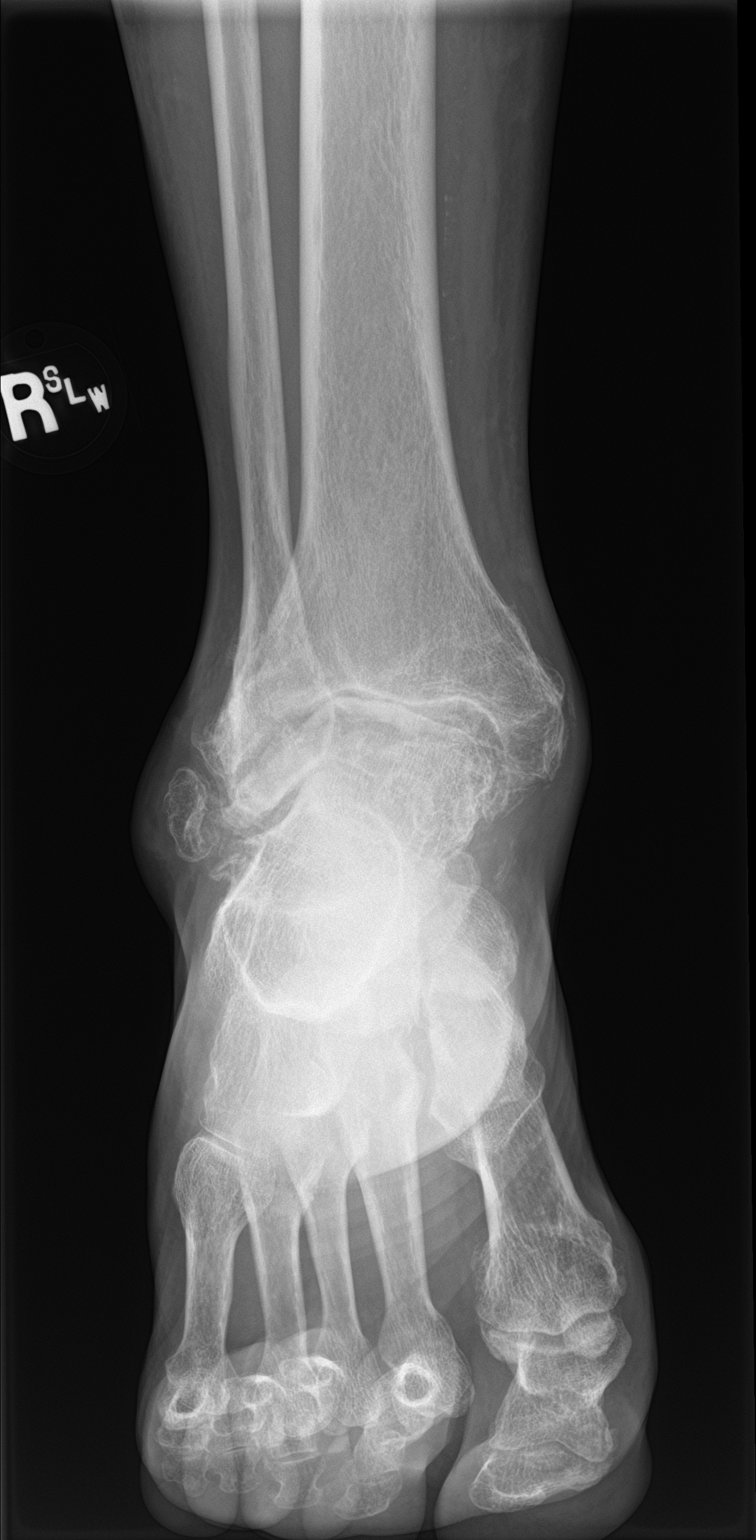

[ankle obl]
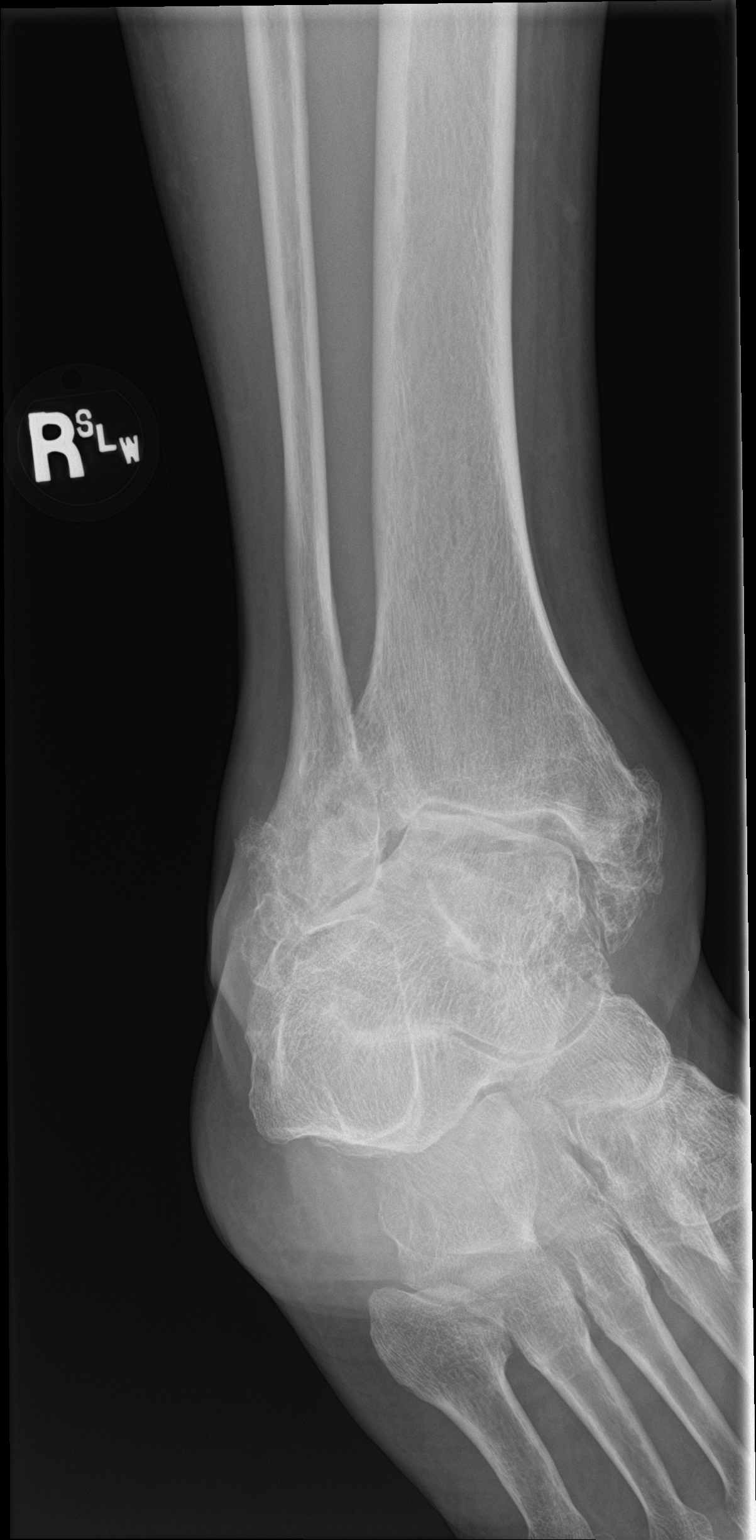

[ankle lat]
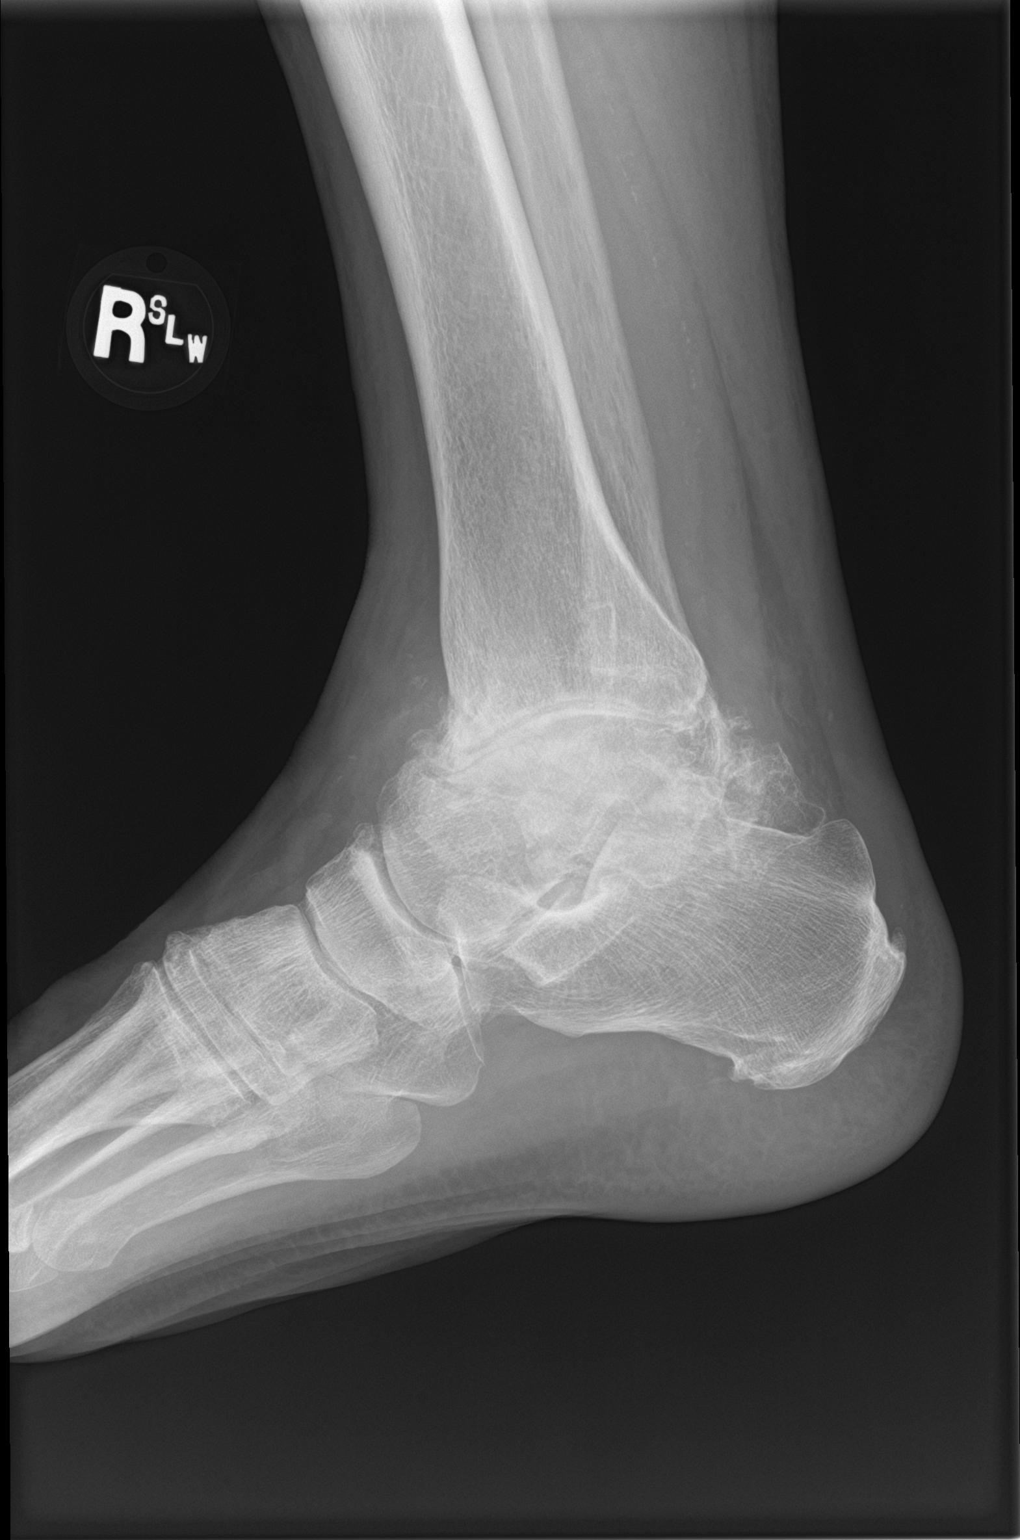

[3 of 3 positions shown; findings below may reference images not displayed]

FINDINGS: There is marked inferior tilt of the medial talar dome. A large well
corticated ossific fragment is seen adjacent to the lateral
malleolus. The patient has severe tibiotalar osteoarthritis with
marked joint space narrowing, subchondral sclerosis and
osteophytosis. Osteoarthritic change about the posterior facet of
the subtalar joint is also seen. Large os trigonum is noted. No
acute bony abnormality is identified. Soft tissues are unremarkable.
IMPRESSION: No acute abnormality.

Severe tibiotalar and moderate subtalar osteoarthritis.

Marked medial tilt of the talus may be result in lateral hindfoot
impingement.

Large ossific fragment off the lateral malleolus could be
degenerative or posttraumatic.

Large os trigonum.
